# Patient Record
Sex: Male | Born: 1970 | Race: White | Hispanic: Yes | Marital: Married | Smoking: Never smoker
Health system: Southern US, Community
[De-identification: ages and names within clinical notes are randomized; demographics above are authoritative.]

---

## 2003-12-10 ENCOUNTER — Emergency Department (HOSPITAL_COMMUNITY): Admission: EM | Admit: 2003-12-10 | Discharge: 2003-12-10 | Payer: Self-pay | Admitting: Emergency Medicine

## 2003-12-13 ENCOUNTER — Emergency Department (HOSPITAL_COMMUNITY): Admission: EM | Admit: 2003-12-13 | Discharge: 2003-12-13 | Payer: Self-pay | Admitting: Emergency Medicine
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
## 2015-09-25 ENCOUNTER — Ambulatory Visit (INDEPENDENT_AMBULATORY_CARE_PROVIDER_SITE_OTHER): Payer: Self-pay | Admitting: Physician Assistant

## 2015-09-25 VITALS — BP 117/73 | HR 65 | Temp 97.6°F | Resp 16 | Ht 67.0 in | Wt 178.0 lb

## 2015-09-25 DIAGNOSIS — Z130 Encounter for screening for diseases of the blood and blood-forming organs and certain disorders involving the immune mechanism: Secondary | ICD-10-CM

## 2015-09-25 DIAGNOSIS — Z13228 Encounter for screening for other metabolic disorders: Secondary | ICD-10-CM

## 2015-09-25 DIAGNOSIS — Z114 Encounter for screening for human immunodeficiency virus [HIV]: Secondary | ICD-10-CM

## 2015-09-25 DIAGNOSIS — Z139 Encounter for screening, unspecified: Secondary | ICD-10-CM

## 2015-09-25 DIAGNOSIS — Z1389 Encounter for screening for other disorder: Secondary | ICD-10-CM

## 2015-09-25 LAB — POCT URINALYSIS DIP (MANUAL ENTRY)
BILIRUBIN UA: NEGATIVE
Bilirubin, UA: NEGATIVE
Blood, UA: NEGATIVE
GLUCOSE UA: NEGATIVE
LEUKOCYTES UA: NEGATIVE
Nitrite, UA: NEGATIVE
Protein Ur, POC: NEGATIVE
SPEC GRAV UA: 1.01
Urobilinogen, UA: 0.2
pH, UA: 7

## 2015-09-25 LAB — CBC WITH DIFFERENTIAL/PLATELET
BASOS ABS: 0 {cells}/uL (ref 0–200)
Basophils Relative: 0 %
EOS ABS: 48 {cells}/uL (ref 15–500)
Eosinophils Relative: 1 %
HEMATOCRIT: 49.5 % (ref 38.5–50.0)
HEMOGLOBIN: 17.2 g/dL — AB (ref 13.2–17.1)
LYMPHS ABS: 2160 {cells}/uL (ref 850–3900)
Lymphocytes Relative: 45 %
MCH: 31.3 pg (ref 27.0–33.0)
MCHC: 34.7 g/dL (ref 32.0–36.0)
MCV: 90 fL (ref 80.0–100.0)
MONO ABS: 384 {cells}/uL (ref 200–950)
MPV: 10.3 fL (ref 7.5–12.5)
Monocytes Relative: 8 %
NEUTROS PCT: 46 %
Neutro Abs: 2208 cells/uL (ref 1500–7800)
Platelets: 191 10*3/uL (ref 140–400)
RBC: 5.5 MIL/uL (ref 4.20–5.80)
RDW: 13.5 % (ref 11.0–15.0)
WBC: 4.8 10*3/uL (ref 3.8–10.8)

## 2015-09-25 NOTE — Progress Notes (Signed)
   Omar Rivas  MRN: [id number] DOB: 1970/12/01  Subjective:  Pt presents to clinic for a surgical clearance for eyelid reconstruction next week.  He is here to have lab work done and other orders from [facility].  He is healthy and does not have medication problems that he knows of.  There are no active problems to display for this patient.   No current outpatient prescriptions on file prior to visit.   No current facility-administered medications on file prior to visit.    No Known Allergies  Review of Systems  Constitutional: Negative.   HENT: Negative.   Eyes: Negative.   Respiratory: Negative.   Cardiovascular: Negative.   Gastrointestinal: Negative.   Endocrine: Negative.   Genitourinary: Negative.   Musculoskeletal: Negative.   Skin: Negative.   Allergic/Immunologic: Negative.   Neurological: Negative.   Hematological: Negative.   Psychiatric/Behavioral: Negative.    Objective:  BP 117/73 mmHg  Pulse 65  Temp(Src) 97.6 F (36.4 C) (Oral)  Resp 16  Ht 5\' 7"  (1.702 m)  Wt 178 lb (80.74 kg)  BMI 27.87 kg/m2  SpO2 99%  Physical Exam  Constitutional: He is oriented to person, place, and time and well-developed, well-nourished, and in no distress.  HENT:  Head: Normocephalic and atraumatic.  Right Ear: External ear normal.  Left Ear: External ear normal.  Eyes: Conjunctivae are normal.  Neck: Normal range of motion.  Cardiovascular: Normal rate, regular rhythm and normal heart sounds.   No murmur heard. Pulmonary/Chest: Effort normal and breath sounds normal. He has no wheezes.  Neurological: He is alert and oriented to person, place, and time. Gait normal.  Skin: Skin is warm and dry.  Psychiatric: Mood, memory, affect and judgment normal.    Assessment and Plan :  Screening for deficiency anemia - Plan: CBC with Differential/Platelet  Encounter for screening for HIV - Plan: HIV antibody  Screening for metabolic  disorder - Plan: COMPLETE METABOLIC PANEL WITH GFR  Screening for blood or protein in urine - Plan: POCT urinalysis dipstick  Screening - Plan: Protime-INR, APTT, EKG 12-Lead   If all labs are ok he should not have increased risk for surgery and we will fax information to [facility] [address], [place] [phone] and Fax [phone]  Benny LennertSarah Quandarius Nill PA-C  [facility] 09/25/2015 11:29 AM

## 2015-09-25 NOTE — Patient Instructions (Signed)
     IF you received an x-ray today, you will receive an invoice from [facility]. Please contact [facility] at [phone] with questions or concerns regarding your invoice.   IF you received labwork today, you will receive an invoice from [facility]. Please contact [facility] at [phone] with questions or concerns regarding your invoice.   Our billing staff will not be able to assist you with questions regarding bills from these companies.  You will be contacted with the lab results as soon as they are available. The fastest way to get your results is to activate your My Chart account. Instructions are located on the last page of this paperwork. If you have not heard from us regarding the results in 2 weeks, please contact this office.      

## 2015-09-26 ENCOUNTER — Encounter: Payer: Self-pay | Admitting: Physician Assistant

## 2015-09-26 LAB — COMPLETE METABOLIC PANEL WITH GFR
ALBUMIN: 4.7 g/dL (ref 3.6–5.1)
ALK PHOS: 77 U/L (ref 40–115)
ALT: 21 U/L (ref 9–46)
AST: 21 U/L (ref 10–40)
BUN: 12 mg/dL (ref 7–25)
CHLORIDE: 104 mmol/L (ref 98–110)
CO2: 26 mmol/L (ref 20–31)
Calcium: 9.8 mg/dL (ref 8.6–10.3)
Creat: 0.89 mg/dL (ref 0.60–1.35)
GFR, Est African American: >89 mL/min (ref >=60)
GLUCOSE: 85 mg/dL (ref 65–99)
POTASSIUM: 4.3 mmol/L (ref 3.5–5.3)
SODIUM: 138 mmol/L (ref 135–146)
TOTAL PROTEIN: 7.7 g/dL (ref 6.1–8.1)
Total Bilirubin: 0.6 mg/dL (ref 0.2–1.2)

## 2015-09-26 LAB — APTT: aPTT: 30 s (ref 22–34)

## 2015-09-26 LAB — HIV ANTIBODY (ROUTINE TESTING W REFLEX): HIV 1&2 Ab, 4th Generation: NONREACTIVE

## 2015-09-26 LAB — PROTIME-INR
INR: 0.9
PROTHROMBIN TIME: 10.1 s (ref 9.0–11.5)
